# Patient Record
Sex: Male | Born: 1975 | Race: White | Hispanic: No | Marital: Married | Smoking: Never smoker
Health system: Southern US, Community
[De-identification: ages and names within clinical notes are randomized; demographics above are authoritative.]

---

## 2003-06-06 ENCOUNTER — Emergency Department (HOSPITAL_COMMUNITY): Admission: EM | Admit: 2003-06-06 | Discharge: 2003-06-06 | Payer: Self-pay | Admitting: Emergency Medicine

## 2003-06-08 ENCOUNTER — Observation Stay (HOSPITAL_COMMUNITY): Admission: EM | Admit: 2003-06-08 | Discharge: 2003-06-10 | Payer: Self-pay | Admitting: Emergency Medicine

## 2005-04-09 IMAGING — CT CT HEAD W/O CM
1 of 2 series · 13 of 30 positions shown, 17 images · non-contrast
Comparison: none

CLINICAL DATA: Head and neck pain.
 CT BRAIN SCAN WITHOUT CONTRAST
 Axial scans from the base to the vertex were performed in the unenhanced state.  The ventricular system is normal in size and configuration and the septum is in a normal midline position.  The fourth ventricle and basilar cisterns appear normal.  No acute intracranial abnormality is seen.  No mass effect is noted.  On bone window images no bony abnormality is noted.
 IMPRESSION
 Negative unenhanced CT brain scan.

[Series 3: — · axial · 0.43mm/px · z∈[-151,-31]mm · 13 of 29 slices shown, 17 images]
[im 3/29  brain]
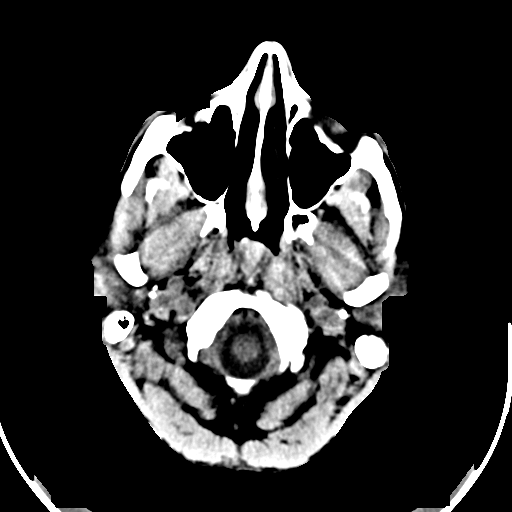
[im 3/29  bone]
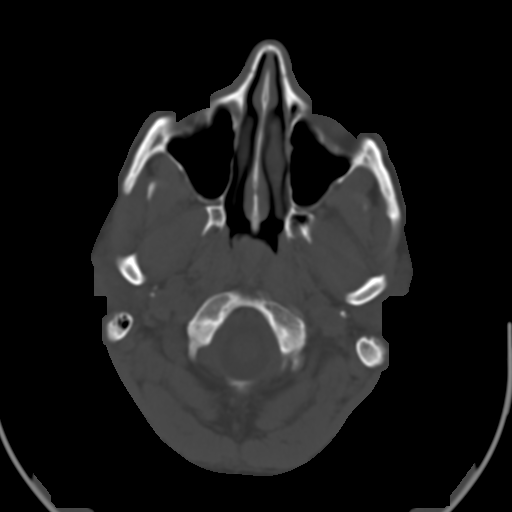
[im 5/29  brain]
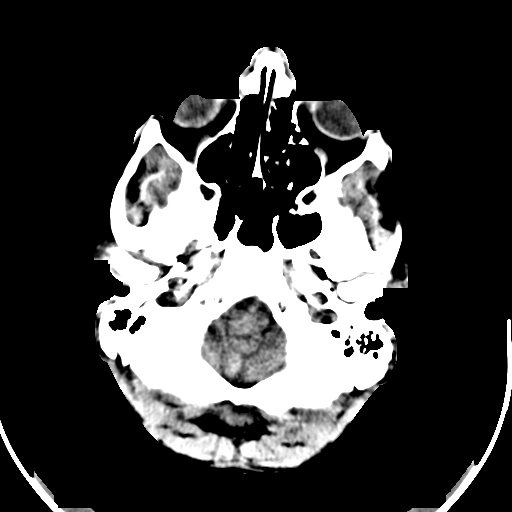
[im 7/29  brain]
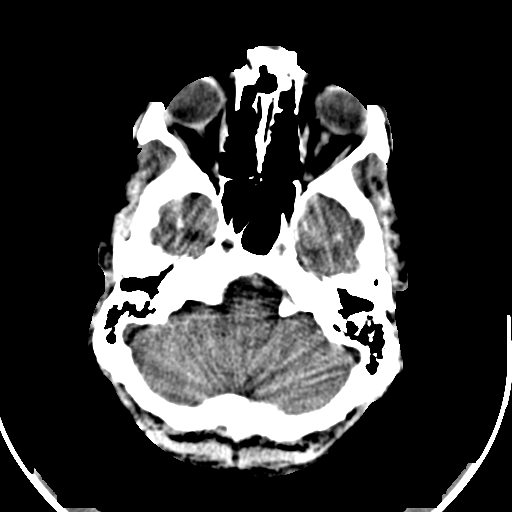
[im 9/29  brain]
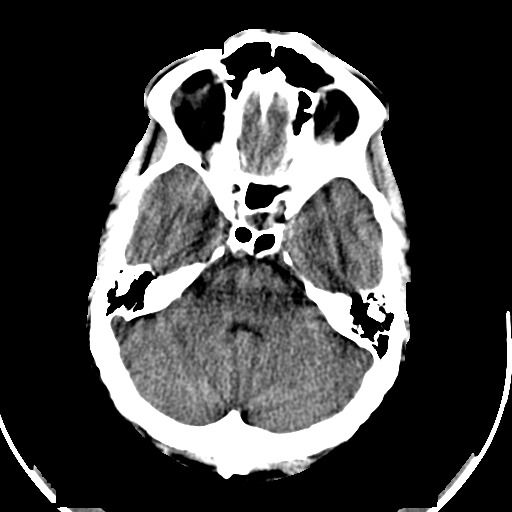
[im 11/29  brain]
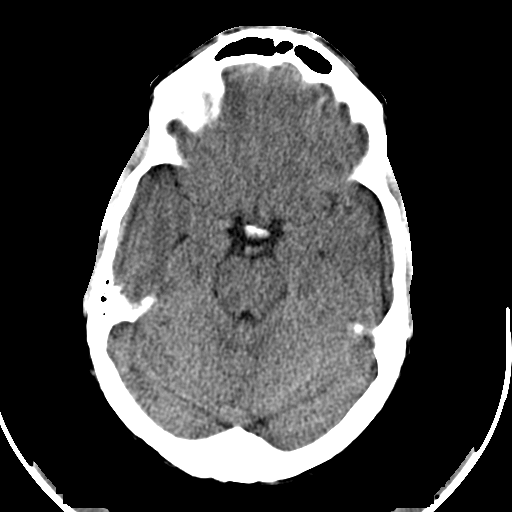
[im 11/29  bone]
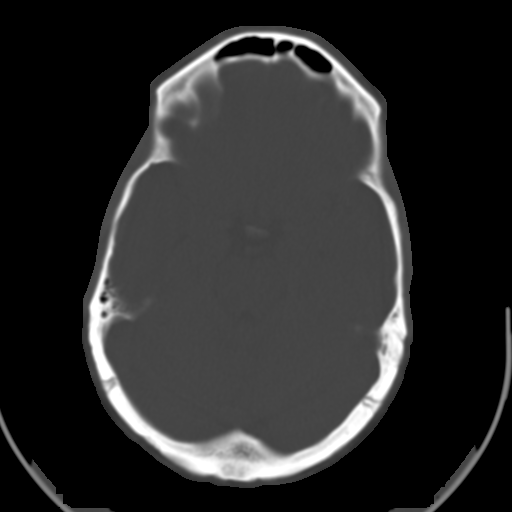
[im 13/29  brain]
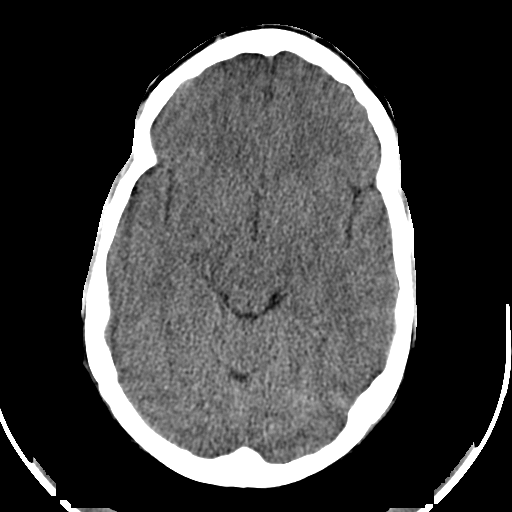
[im 15/29  brain]
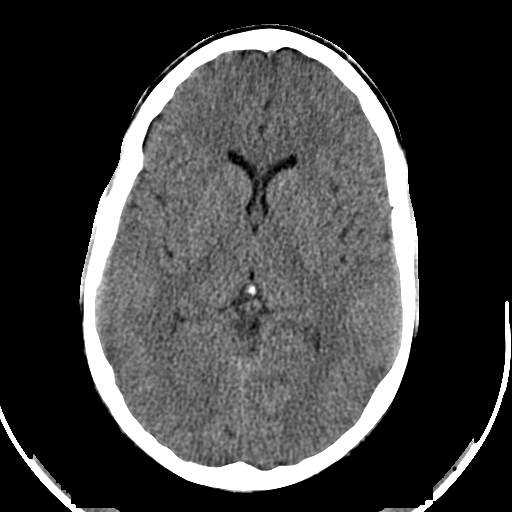
[im 17/29  brain]
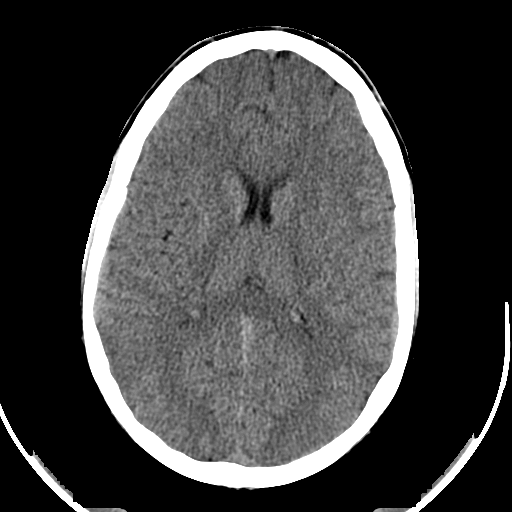
[im 19/29  brain]
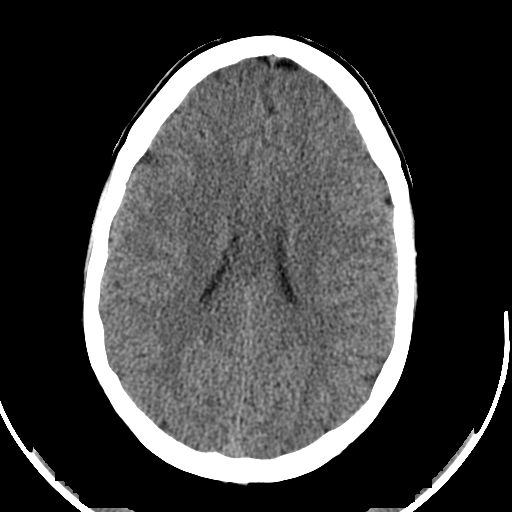
[im 19/29  bone]
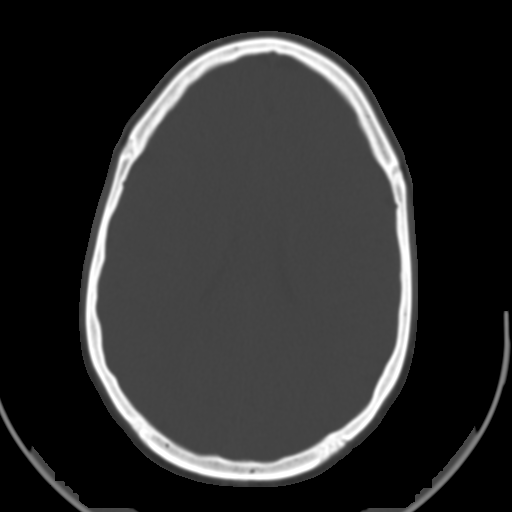
[im 21/29  brain]
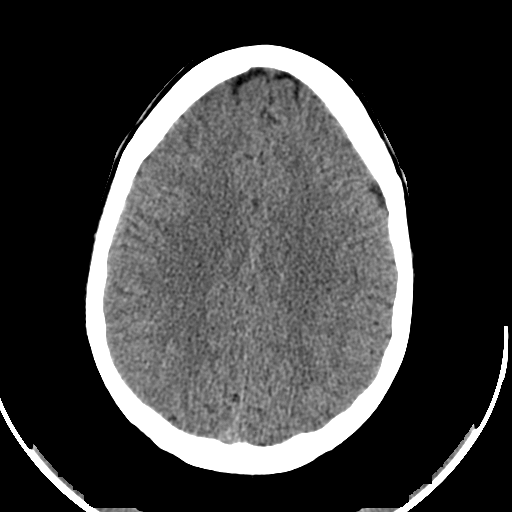
[im 23/29  brain]
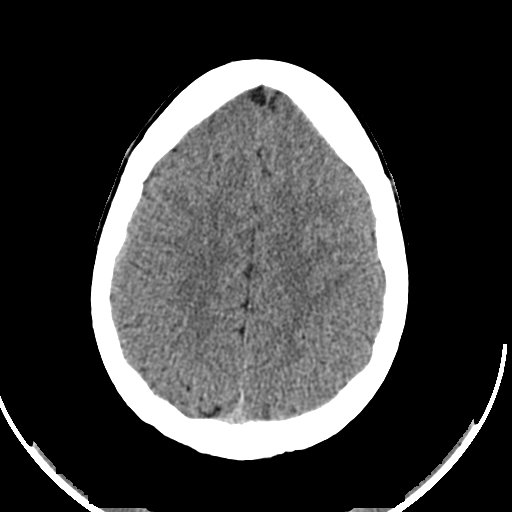
[im 25/29  brain]
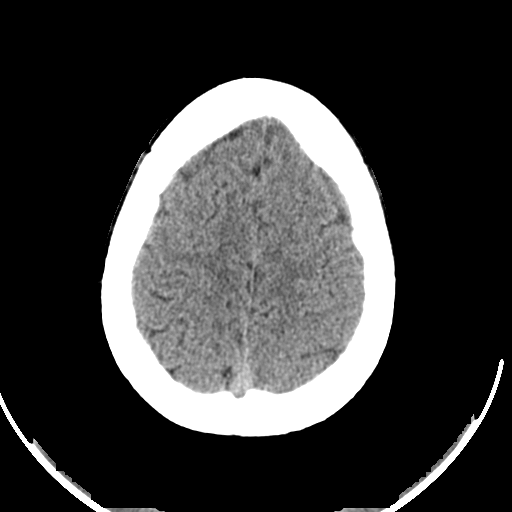
[im 27/29  brain]
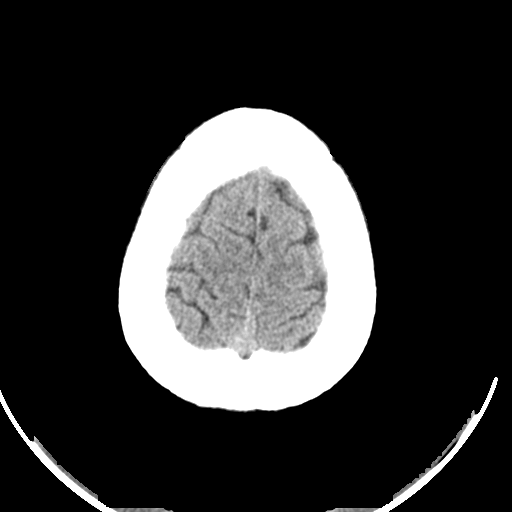
[im 27/29  bone]
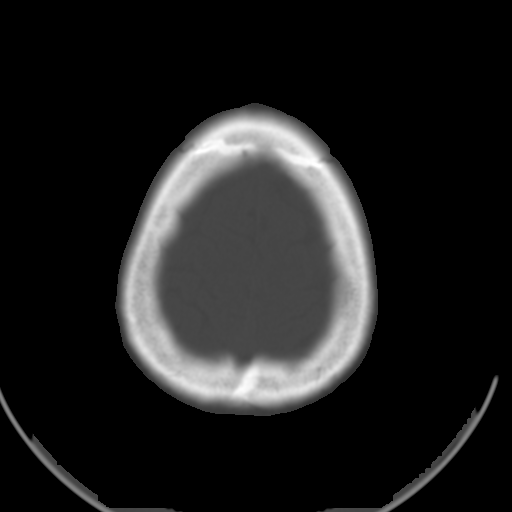

[13 of 30 positions shown; findings below may reference images not displayed]

## 2008-12-11 ENCOUNTER — Emergency Department (HOSPITAL_BASED_OUTPATIENT_CLINIC_OR_DEPARTMENT_OTHER): Admission: EM | Admit: 2008-12-11 | Discharge: 2008-12-11 | Payer: Self-pay | Admitting: Emergency Medicine

## 2010-10-09 NOTE — Discharge Summary (Signed)
NAME:  Perry White, Perry White                           ACCOUNT NO.:  [id number]   MEDICAL RECORD NO.:  [id number]                   PATIENT TYPE:  INP   LOCATION:  0349                                 FACILITY:  [facility]   PHYSICIAN:  Corinna L. Lendell Caprice, MD             DATE OF BIRTH:  05-13-1976   DATE OF ADMISSION:  06/08/2003  DATE OF DISCHARGE:  06/10/2003                                 DISCHARGE SUMMARY   DIAGNOSES:  1. Severe intractable headache, improved.  2. Probable sinusitis.  3. Probable viral illness.   DISCHARGE MEDICATIONS:  1. Dilaudid 2 to 4 mg p.o. q.4 h. as needed for pain.  2. Afrin two sprays per nostril b.i.d. for 3 days.  3. Sudafed as needed.   FOLLOW UP:  With primary care physician as needed.   CONDITION ON DISCHARGE:  Stable.   ACTIVITY:  Ad lib.   DIET:  As tolerated.   PROCEDURES:  None.   HISTORY AND HOSPITAL COURSE:  [name] is a 35 year old white male who  presented to the emergency room two times prior to admission.  The first  time he had the worse headache of his life.  He had been having fevers and  had an LP which was unremarkable.  It had few white cells or red cells.  His  fever improved but his headache was worse and he had a vasovagal syncopal  episode.  He had no history of tick bites.  He also was having some chest  pain.  He had been on Biaxin and Vicodin after his ER visit.  His white  count when he came into the emergency room was 2.5.  On the 15th it was 4.2.  His platelet count on the 13th was 125 and on the 15th it was 138.  His H&H  and the rest of his laboratories were normal including electrolytes.  He had  no meningismus.  He had normal vital signs.  He denied photophobia or rash.  He was admitted for IV fluids and pain control and his EKG was sinus  bradycardia at 46.  His chest x-ray showed borderline cardiomegaly and he  was admitted to telemetry.  His headache improved somewhat with Dilaudid and  Sudafed and Afrin.  This was  started because his headache was located in the  frontal sinuses but radiated to the occiput.  A CAT scan of the head was  negative as was MRI scan.  Ehrlichiosis serologies were sent and are  pending.  However, I feel ehrlichiosis would be highly unlikely.  The  headache did not change in nature after LP and so it is unlikely that this  is a post spinal tap headache.  I suspect this is viral in etiology given  his fever and mild neutropenia and thrombocytopenia.  But he was feeling  better and afebrile at the time of discharge and can be safely discharged to  home.                                               Corinna L. Lendell Caprice, MD    CLS/MEDQ  D:  06/10/2003  T:  06/10/2003  Job:  [id number]   cc:   Deboraha Sprang @ [facility]

## 2019-02-26 ENCOUNTER — Ambulatory Visit: Payer: Self-pay | Admitting: Family Medicine

## 2019-02-26 DIAGNOSIS — Z0289 Encounter for other administrative examinations: Secondary | ICD-10-CM

## 2019-03-06 ENCOUNTER — Encounter: Payer: Self-pay | Admitting: Family Medicine

## 2019-04-18 ENCOUNTER — Other Ambulatory Visit: Payer: Self-pay

## 2019-04-18 DIAGNOSIS — Z20822 Contact with and (suspected) exposure to covid-19: Secondary | ICD-10-CM

## 2019-04-19 LAB — NOVEL CORONAVIRUS, NAA: SARS-CoV-2, NAA: NOT DETECTED

## 2019-05-14 ENCOUNTER — Other Ambulatory Visit: Payer: Self-pay

## 2019-05-15 ENCOUNTER — Other Ambulatory Visit: Payer: Self-pay

## 2019-05-16 ENCOUNTER — Ambulatory Visit: Payer: BC Managed Care – PPO | Attending: Internal Medicine

## 2019-05-16 DIAGNOSIS — Z20822 Contact with and (suspected) exposure to covid-19: Secondary | ICD-10-CM

## 2019-05-17 LAB — NOVEL CORONAVIRUS, NAA: SARS-CoV-2, NAA: NOT DETECTED

## 2020-04-28 ENCOUNTER — Ambulatory Visit: Payer: Self-pay | Attending: Internal Medicine

## 2020-04-28 DIAGNOSIS — Z23 Encounter for immunization: Secondary | ICD-10-CM

## 2020-04-28 NOTE — Progress Notes (Signed)
   Covid-19 Vaccination Clinic  Name:  Perry White    MRN: [id number] DOB: 1976/05/03  04/28/2020  Perry White was observed post Covid-19 immunization for 15 minutes without incident. He was provided with Vaccine Information Sheet and instruction to access the V-Safe system.   Perry White was instructed to call 911 with any severe reactions post vaccine: Marland Kitchen Difficulty breathing  . Swelling of face and throat  . A fast heartbeat  . A bad rash all over body  . Dizziness and weakness   Immunizations Administered    Name Date Dose VIS Date Route   Pfizer COVID-19 Vaccine 04/28/2020  1:16 PM 0.3 mL 03/12/2020 Intramuscular   Manufacturer: [place]   Lot: [id number]   [place]: [id number]

## 2020-05-11 ENCOUNTER — Emergency Department: Admit: 2020-05-11 | Discharge status: Against Medical Advice | Payer: Self-pay

## 2020-05-11 ENCOUNTER — Telehealth: Payer: Self-pay | Admitting: Emergency Medicine

## 2020-05-11 NOTE — Telephone Encounter (Signed)
Left message for pt to call [facility] [phone]. Tim robotic assisted lap surgery on 05/08/20, complaints of being impacted today on an "on my way visit". Telephone call in chart from today from [facility] triage RN- no notes at this time. Call to see if pt was directed to the ER in [place] and not the Urgent care

## 2021-04-28 LAB — COLOGUARD

## 2021-05-27 LAB — COLOGUARD: COLOGUARD: NEGATIVE

## 2021-07-01 ENCOUNTER — Other Ambulatory Visit: Payer: Self-pay

## 2021-07-01 ENCOUNTER — Ambulatory Visit (INDEPENDENT_AMBULATORY_CARE_PROVIDER_SITE_OTHER): Payer: Managed Care, Other (non HMO) | Admitting: Dermatology

## 2021-07-01 ENCOUNTER — Encounter: Payer: Self-pay | Admitting: Dermatology

## 2021-07-01 DIAGNOSIS — L821 Other seborrheic keratosis: Secondary | ICD-10-CM

## 2021-07-01 DIAGNOSIS — A630 Anogenital (venereal) warts: Secondary | ICD-10-CM | POA: Diagnosis not present

## 2021-07-01 DIAGNOSIS — D1801 Hemangioma of skin and subcutaneous tissue: Secondary | ICD-10-CM | POA: Diagnosis not present

## 2021-07-01 DIAGNOSIS — L738 Other specified follicular disorders: Secondary | ICD-10-CM

## 2021-07-01 DIAGNOSIS — Z1283 Encounter for screening for malignant neoplasm of skin: Secondary | ICD-10-CM

## 2021-07-01 NOTE — Patient Instructions (Signed)
OVER THE COUNTER "COMPOUND W" OR A WART FREEZE COULD HELP

## 2021-07-20 ENCOUNTER — Encounter: Payer: Self-pay | Admitting: Dermatology

## 2021-07-20 NOTE — Progress Notes (Signed)
° °  Follow-Up Visit   Subjective  Perry White is a 46 y.o. male who presents for the following: Skin Problem (Lesion on penis x month. ).  A new spot on penis, general skin check Location:  Duration:  Quality:  Associated Signs/Symptoms: Modifying Factors:  Severity:  Timing: Context:   Objective  Well appearing patient in no apparent distress; mood and affect are within normal limits. No atypical pigmented lesions or nonmelanoma skin cancer.  Multiple 1 mm smooth red dermal papules  Mid Back Brown textured flattopped 6 mm papule  Head - Anterior (Face) Several 2 mm flesh-colored eccentrically umbilicated papules with typical dermoscopy  Dorsal Penile Shaft (2) 3 mm tan slightly verrucous papule    A full examination was performed including scalp, head, eyes, ears, nose, lips, neck, chest, axillae, abdomen, back, buttocks, bilateral upper extremities, bilateral lower extremities, hands, feet, fingers, toes, fingernails, and toenails. All findings within normal limits unless otherwise noted below.   Assessment & Plan    Screening exam for skin cancer  Encouraged to self examine twice annually  Seborrheic keratosis Mid Back  Leave if stable  Sebaceous hyperplasia of face Head - Anterior (Face)  Told of similar appearance of early BCC so if there is growth or bleeding return for biopsy  Cherry angioma  No intervention necessary  Genital warts (2) Dorsal Penile Shaft  Destruction of lesion - Dorsal Penile Shaft Complexity: simple   Destruction method: cryotherapy   Informed consent: discussed and consent obtained   Timeout:  patient name, date of birth, surgical site, and procedure verified Lesion destroyed using liquid nitrogen: Yes   Cryotherapy cycles:  3 Outcome: patient tolerated procedure well with no complications   Post-procedure details: wound care instructions given        I, Lavonna Monarch, MD, have reviewed all documentation for this  visit.  The documentation on 07/20/21 for the exam, diagnosis, procedures, and orders are all accurate and complete.

## 2022-07-05 ENCOUNTER — Ambulatory Visit: Payer: Managed Care, Other (non HMO) | Admitting: Dermatology

## 2024-03-28 ENCOUNTER — Ambulatory Visit (HOSPITAL_BASED_OUTPATIENT_CLINIC_OR_DEPARTMENT_OTHER): Admitting: Student

## 2024-03-28 ENCOUNTER — Ambulatory Visit (INDEPENDENT_AMBULATORY_CARE_PROVIDER_SITE_OTHER)

## 2024-03-28 DIAGNOSIS — M79641 Pain in right hand: Secondary | ICD-10-CM

## 2024-03-28 DIAGNOSIS — M79644 Pain in right finger(s): Secondary | ICD-10-CM | POA: Diagnosis not present

## 2024-03-28 NOTE — Progress Notes (Signed)
 Chief Complaint: Right thumb pain    Discussed the use of AI scribe software for clinical note transcription with the patient, who gave verbal consent to proceed.  History of Present Illness Perry White is a 48 year old male who presents with right thumb pain after a disc golf injury.  He experiences pain in the right thumb following a disc golf incident yesterday where his hand collided with a tree during the follow-through. The pain has worsened since the incident and is primarily located in the thumb without radiation. It intensifies when closing a fist or manipulating the thumb. There is no numbness or tingling. He has a previous thumb injury from playing basketball as a child, resulting in a limited ability to fully close his fist. No known fractures in the past. He took Tylenol for pain management and wrapped the thumb. He slept without significant restriction. 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 No pain in the wrist and the wrist is moving fine.   Surgical History:   None  PMH/PSH/Family History/Social History/Meds/Allergies:   No past medical history on file. No past surgical history on file. Social History   Socioeconomic History   Marital status: Married    Spouse name: Not on file   Number of children: Not on file   Years of education: Not on file   Highest education level: Not on file  Occupational History   Not on file  Tobacco Use   Smoking status: Not on file   Smokeless tobacco: Not on file  Substance and Sexual Activity   Alcohol use: Not on file   Drug use: Not on file   Sexual activity: Not on file  Other Topics Concern   Not on file  Social History Narrative   Not on file   Social Drivers of Health   Financial Resource Strain: Low Risk  (08/01/2023)   Received from [facility]   Overall Financial Resource Strain (CARDIA)    Difficulty of Paying Living Expenses: Not hard at all  Food Insecurity: No Food  Insecurity (08/01/2023)   Received from [facility]   Hunger Vital Sign    Within the past 12 months, you worried that your food would run out before you got the money to buy more.: Never true    Within the past 12 months, the food you bought just didn't last and you didn't have money to get more.: Never true  Transportation Needs: No Transportation Needs (08/01/2023)   Received from [facility] - Transportation    Lack of Transportation (Medical): No    Lack of Transportation (Non-Medical): No  Physical Activity: Sufficiently Active (08/01/2023)   Received from [facility]   Exercise Vital Sign    On average, how many days per week do you engage in moderate to strenuous exercise (like a brisk walk)?: 5 days    On average, how many minutes do you engage in exercise at this level?: 30 min  Stress: Stress Concern Present (08/01/2023)   Received from [facility] - Occupational Stress Questionnaire    Feeling of Stress : To some extent  Social Connections: Moderately Integrated (08/01/2023)   Received from [facility]   Social Network    How would you rate your social  network (family, work, friends)?: Adequate participation with social networks   No family history on file. Allergies  Allergen Reactions   Penicillins    Current Outpatient Medications  Medication Sig Dispense Refill   benazepril (LOTENSIN) 20 MG tablet Take 20 mg by mouth daily.     No current facility-administered medications for this visit.   No results found.  Review of Systems:   A ROS was performed including pertinent positives and negatives as documented in the HPI.  Physical Exam :   Constitutional: NAD and appears stated age Neurological: Alert and oriented Psych: Appropriate affect and cooperative There were no vitals taken for this visit.   Comprehensive Musculoskeletal Exam:    Exam of the right thumb demonstrates no obvious deformity.   There is some mild swelling extending from the MCP to the IP joint without focal ecchymosis.  Tenderness over the MCP joint.  Pain is exacerbated with active range of motion of the MCP although this is intact.  No pain over the IP with full range of motion.  Distal perfusion and sensation is intact.  Imaging:   Xray (right hand 3 views): Negative for acute bony abnormality   I personally reviewed and interpreted the radiographs.      Assessment & Plan Right thumb soft tissue injury and pain   Patient sustained a direct impact to the right thumb yesterday while playing disc golf.  X-rays today show no evidence of fracture or dislocation.  Symptoms are fairly confined to the MCP joint although no significant tendon injury is suspected due to intact flexion and extension.  Suspect more of a mild sprain/strain injury and will expect this to improve with conservative management.  Recommend immobilization of the right thumb with follow-up as needed in 2 weeks if symptoms continue to persist.  Could consider advanced imaging at that time if needed.     I personally saw and evaluated the patient, and participated in the management and treatment plan.  Leonce Reveal, PA-C Orthopedics

## 2024-04-04 ENCOUNTER — Ambulatory Visit (HOSPITAL_BASED_OUTPATIENT_CLINIC_OR_DEPARTMENT_OTHER): Admitting: Student

## 2024-04-04 ENCOUNTER — Ambulatory Visit (INDEPENDENT_AMBULATORY_CARE_PROVIDER_SITE_OTHER)

## 2024-04-04 ENCOUNTER — Ambulatory Visit
Admission: RE | Admit: 2024-04-04 | Discharge: 2024-04-04 | Disposition: A | Discharge status: Home / Self Care | Source: Ambulatory Visit | Attending: Nurse Practitioner | Admitting: Nurse Practitioner

## 2024-04-04 VITALS — BP 114/70 | HR 59 | Temp 98.1°F | Resp 20

## 2024-04-04 DIAGNOSIS — W19XXXA Unspecified fall, initial encounter: Secondary | ICD-10-CM | POA: Diagnosis not present

## 2024-04-04 DIAGNOSIS — M7918 Myalgia, other site: Secondary | ICD-10-CM

## 2024-04-04 DIAGNOSIS — M79604 Pain in right leg: Secondary | ICD-10-CM

## 2024-04-04 DIAGNOSIS — T148XXA Other injury of unspecified body region, initial encounter: Secondary | ICD-10-CM | POA: Diagnosis not present

## 2024-04-04 MED ORDER — TIZANIDINE HCL 4 MG PO TABS: 4.0000 mg | ORAL_TABLET | Freq: Three times a day (TID) | ORAL | 0 refills | Status: AC | PRN

## 2024-04-04 MED ORDER — PREDNISONE 20 MG PO TABS: 40.0000 mg | ORAL_TABLET | Freq: Every day | ORAL | 0 refills | Status: AC

## 2024-04-04 MED ORDER — ACETAMINOPHEN ER 650 MG PO TBCR: 650.0000 mg | EXTENDED_RELEASE_TABLET | Freq: Three times a day (TID) | ORAL | 0 refills | Status: AC | PRN

## 2024-04-04 NOTE — ED Provider Notes (Signed)
 [facility]    CSN: [id number] Arrival date & time: 04/04/24  1223      History   Chief Complaint Chief Complaint  Patient presents with   Leg Pain    I fell while hiking and injured my lower back right leg. The pain is really more sore in my butt leg than it is in my lower back. He gets better throughout the day as it loosens up so I think it is muscular. - Entered by patient    HPI Perry White is a 48 y.o. male.   The history is provided by the patient.   Patient presents with a 3-day history of left buttock pain and right leg pain.  Patient states that he went hiking, and fell onto his left buttock.  He states that he believes that he tensed up and since that time he has had pain to the right upper leg.  He states that the pain on the backside of the right thigh and can sometimes radiate into the right buttock.  He denies bruising, swelling, numbness, tingling, loss of bowel or bladder function, urinary symptoms, or saddle anesthesia.  Patient states that his symptoms improve once he is up moving around.  States that when he is sitting or doing stationary activities, he feels that the muscles in the right leg are tightening up again.  Patient states that he has been taking Tylenol for his symptoms.  History reviewed. No pertinent past medical history.  There are no active problems to display for this patient.   History reviewed. No pertinent surgical history.     Home Medications    Prior to Admission medications   Medication Sig Start Date End Date Taking? Authorizing Provider  acetaminophen (TYLENOL 8 HOUR) 650 MG CR tablet Take 1 tablet (650 mg total) by mouth every 8 (eight) hours as needed for pain. 04/04/24  Yes Leath-Warren, Etta PARAS, NP  predniSONE (DELTASONE) 20 MG tablet Take 2 tablets (40 mg total) by mouth daily with breakfast for 5 days. 04/04/24 04/09/24 Yes Leath-Warren, Etta PARAS, NP  tiZANidine (ZANAFLEX) 4 MG tablet Take 1 tablet (4 mg  total) by mouth every 8 (eight) hours as needed. 04/04/24  Yes Leath-Warren, Etta PARAS, NP  benazepril (LOTENSIN) 20 MG tablet Take 20 mg by mouth daily. 06/11/21   [provider]    Family History History reviewed. No pertinent family history.  Social History Social History   Tobacco Use   Smoking status: Never   Smokeless tobacco: Never     Allergies   Penicillins   Review of Systems Review of Systems Per HPI  Physical Exam Triage Vital Signs ED Triage Vitals  Encounter Vitals Group     BP 04/04/24 1237 114/70     Girls Systolic BP Percentile --      Girls Diastolic BP Percentile --      Boys Systolic BP Percentile --      Boys Diastolic BP Percentile --      Pulse Rate 04/04/24 1237 (!) 59     Resp 04/04/24 1237 20     Temp 04/04/24 1237 98.1 F (36.7 C)     Temp Source 04/04/24 1237 Oral     SpO2 04/04/24 1237 95 %     Weight --      Height --      Head Circumference --      Peak Flow --      Pain Score 04/04/24 1239 7  Pain Loc --      Pain Education --      Exclude from Growth Chart --    No data found.  Updated Vital Signs BP 114/70 (BP Location: Right Arm)   Pulse (!) 59   Temp 98.1 F (36.7 C) (Oral)   Resp 20   SpO2 95%   Visual Acuity Right Eye Distance:   Left Eye Distance:   Bilateral Distance:    Right Eye Near:   Left Eye Near:    Bilateral Near:     Physical Exam Vitals and nursing note reviewed.  Constitutional:      General: He is not in acute distress.    Appearance: Normal appearance.  Eyes:     Extraocular Movements: Extraocular movements intact.     Conjunctiva/sclera: Conjunctivae normal.     Pupils: Pupils are equal, round, and reactive to light.  Cardiovascular:     Rate and Rhythm: Normal rate.     Pulses: Normal pulses.     Heart sounds: Normal heart sounds.  Pulmonary:     Effort: Pulmonary effort is normal.     Breath sounds: Normal breath sounds.  Musculoskeletal:     Cervical back: Normal  range of motion.     Lumbar back: Signs of trauma and spasms present. No swelling, deformity or tenderness. Decreased range of motion. Negative right straight leg raise test and negative left straight leg raise test.       Back:     Right upper leg: No swelling, tenderness or bony tenderness.       Legs:  Skin:    General: Skin is warm and dry.  Neurological:     General: No focal deficit present.     Mental Status: He is alert and oriented to person, place, and time.  Psychiatric:        Mood and Affect: Mood normal.        Behavior: Behavior normal.      UC Treatments / Results  Labs (all labs ordered are listed, but only abnormal results are displayed) Labs Reviewed - No data to display  EKG   Radiology No results found.  Procedures Procedures (including critical care time)  Medications Ordered in UC Medications - No data to display  Initial Impression / Assessment and Plan / UC Course  I have reviewed the triage vital signs and the nursing notes.  Pertinent labs & imaging results that were available during my care of the patient were reviewed by me and considered in my medical decision making (see chart for details).  X-ray of the sacrum/coccyx is pending.  On exam, patient does not have any obvious bruising, swelling, or erythema present.  Symptoms consistent with possible soft tissue injury of the left buttock and pulled muscle of the right leg.  Will treat with prednisone 40 mg, tizanidine 4 mg, and Tylenol 650 mg.  Supportive care recommendations were provided and discussed with the patient to include the use of ice or heat, stretching exercises, and staying active.  Patient was given strict ER follow-up precautions, along with indications to follow-up with his PCP.  Patient was in agreement with this plan of care and verbalizes understanding.  All questions were answered.  Patient stable for discharge.  Final Clinical Impressions(s) / UC Diagnoses   Final  diagnoses:  Left buttock pain  Pulled muscle  Right leg pain     Discharge Instructions      The x-ray of your sacrum/coccyx is pending.  You will be contacted if the pending test result is abnormal.  You will also have access to the results via MyChart. Take medication as prescribed. Recommend the use of ice or heat.  Apply ice for pain or swelling, heat for spasm or stiffness.  Apply for 20 minutes, remove for 1 hour, repeat as needed while symptoms persist. Gentle stretching and range of motion exercises to help decrease your recovery time.  Try to remain as active as possible while symptoms persist. Go to the emergency department if you experience loss of bowel or bladder function, become unable to ambulate, worsening pain, or other concerns. If your symptoms fail to improve, recommend follow-up with your primary care physician for further evaluation. Follow-up as needed.     ED Prescriptions     Medication Sig Dispense Auth. Provider   predniSONE (DELTASONE) 20 MG tablet Take 2 tablets (40 mg total) by mouth daily with breakfast for 5 days. 10 tablet Leath-Warren, Etta PARAS, NP   tiZANidine (ZANAFLEX) 4 MG tablet Take 1 tablet (4 mg total) by mouth every 8 (eight) hours as needed. 20 tablet Leath-Warren, Etta PARAS, NP   acetaminophen (TYLENOL 8 HOUR) 650 MG CR tablet Take 1 tablet (650 mg total) by mouth every 8 (eight) hours as needed for pain. 30 tablet Leath-Warren, Etta PARAS, NP      PDMP not reviewed this encounter.   Gilmer Etta PARAS, NP 04/04/24 1359

## 2024-04-04 NOTE — ED Triage Notes (Signed)
 Pt reports fall, pain in the left buttocks, pain on the right side of the neck the right side buttocks and leg, the pain shoots up when standing. Pt states he slipped on some rocks while hiking denies hitting head because he landed on his backpack.

## 2024-04-04 NOTE — Discharge Instructions (Addendum)
 The x-ray of your sacrum/coccyx is pending.  You will be contacted if the pending test result is abnormal.  You will also have access to the results via MyChart. Take medication as prescribed. Recommend the use of ice or heat.  Apply ice for pain or swelling, heat for spasm or stiffness.  Apply for 20 minutes, remove for 1 hour, repeat as needed while symptoms persist. Gentle stretching and range of motion exercises to help decrease your recovery time.  Try to remain as active as possible while symptoms persist. Go to the emergency department if you experience loss of bowel or bladder function, become unable to ambulate, worsening pain, or other concerns. If your symptoms fail to improve, recommend follow-up with your primary care physician for further evaluation. Follow-up as needed.
